# Patient Record
Sex: Female | Born: 1935 | Race: White | Hispanic: No | Marital: Single | Smoking: Never smoker
Health system: Southern US, Community
[De-identification: ages and names within clinical notes are randomized; demographics above are authoritative.]

## PROBLEM LIST (undated history)

## (undated) DIAGNOSIS — I639 Cerebral infarction, unspecified: Secondary | ICD-10-CM

## (undated) DIAGNOSIS — I2510 Atherosclerotic heart disease of native coronary artery without angina pectoris: Secondary | ICD-10-CM

## (undated) DIAGNOSIS — F0390 Unspecified dementia without behavioral disturbance: Secondary | ICD-10-CM

## (undated) DIAGNOSIS — G9340 Encephalopathy, unspecified: Secondary | ICD-10-CM

## (undated) DIAGNOSIS — M1990 Unspecified osteoarthritis, unspecified site: Secondary | ICD-10-CM

## (undated) DIAGNOSIS — H409 Unspecified glaucoma: Secondary | ICD-10-CM

## (undated) DIAGNOSIS — I10 Essential (primary) hypertension: Secondary | ICD-10-CM

## (undated) HISTORY — PX: ABDOMINAL HYSTERECTOMY: SHX81

## (undated) HISTORY — PX: APPENDECTOMY: SHX54

## (undated) HISTORY — PX: CHOLECYSTECTOMY: SHX55

---

## 2012-12-05 ENCOUNTER — Emergency Department (HOSPITAL_BASED_OUTPATIENT_CLINIC_OR_DEPARTMENT_OTHER): Payer: Medicare Other

## 2012-12-05 ENCOUNTER — Emergency Department (HOSPITAL_BASED_OUTPATIENT_CLINIC_OR_DEPARTMENT_OTHER)
Admission: EM | Admit: 2012-12-05 | Discharge: 2012-12-05 | Disposition: A | Discharge status: Skilled Nursing Facility | Payer: Medicare Other | Attending: Emergency Medicine | Admitting: Emergency Medicine

## 2012-12-05 ENCOUNTER — Encounter (HOSPITAL_BASED_OUTPATIENT_CLINIC_OR_DEPARTMENT_OTHER): Payer: Self-pay

## 2012-12-05 DIAGNOSIS — I2510 Atherosclerotic heart disease of native coronary artery without angina pectoris: Secondary | ICD-10-CM | POA: Insufficient documentation

## 2012-12-05 DIAGNOSIS — Y929 Unspecified place or not applicable: Secondary | ICD-10-CM | POA: Insufficient documentation

## 2012-12-05 DIAGNOSIS — Z8669 Personal history of other diseases of the nervous system and sense organs: Secondary | ICD-10-CM | POA: Insufficient documentation

## 2012-12-05 DIAGNOSIS — F0390 Unspecified dementia without behavioral disturbance: Secondary | ICD-10-CM | POA: Insufficient documentation

## 2012-12-05 DIAGNOSIS — Z7982 Long term (current) use of aspirin: Secondary | ICD-10-CM | POA: Insufficient documentation

## 2012-12-05 DIAGNOSIS — Z8673 Personal history of transient ischemic attack (TIA), and cerebral infarction without residual deficits: Secondary | ICD-10-CM | POA: Insufficient documentation

## 2012-12-05 DIAGNOSIS — Z8739 Personal history of other diseases of the musculoskeletal system and connective tissue: Secondary | ICD-10-CM | POA: Insufficient documentation

## 2012-12-05 DIAGNOSIS — Z79899 Other long term (current) drug therapy: Secondary | ICD-10-CM | POA: Insufficient documentation

## 2012-12-05 DIAGNOSIS — I10 Essential (primary) hypertension: Secondary | ICD-10-CM | POA: Insufficient documentation

## 2012-12-05 DIAGNOSIS — S0121XA Laceration without foreign body of nose, initial encounter: Secondary | ICD-10-CM

## 2012-12-05 DIAGNOSIS — S0120XA Unspecified open wound of nose, initial encounter: Secondary | ICD-10-CM | POA: Insufficient documentation

## 2012-12-05 DIAGNOSIS — S022XXB Fracture of nasal bones, initial encounter for open fracture: Secondary | ICD-10-CM | POA: Insufficient documentation

## 2012-12-05 DIAGNOSIS — Z23 Encounter for immunization: Secondary | ICD-10-CM | POA: Insufficient documentation

## 2012-12-05 DIAGNOSIS — Y9389 Activity, other specified: Secondary | ICD-10-CM | POA: Insufficient documentation

## 2012-12-05 DIAGNOSIS — W1809XA Striking against other object with subsequent fall, initial encounter: Secondary | ICD-10-CM | POA: Insufficient documentation

## 2012-12-05 HISTORY — DX: Encephalopathy, unspecified: G93.40

## 2012-12-05 HISTORY — DX: Unspecified glaucoma: H40.9

## 2012-12-05 HISTORY — DX: Atherosclerotic heart disease of native coronary artery without angina pectoris: I25.10

## 2012-12-05 HISTORY — DX: Cerebral infarction, unspecified: I63.9

## 2012-12-05 HISTORY — DX: Unspecified dementia, unspecified severity, without behavioral disturbance, psychotic disturbance, mood disturbance, and anxiety: F03.90

## 2012-12-05 HISTORY — DX: Essential (primary) hypertension: I10

## 2012-12-05 HISTORY — DX: Unspecified osteoarthritis, unspecified site: M19.90

## 2012-12-05 MED ORDER — LIDOCAINE HCL (PF) 1 % IJ SOLN
INTRAMUSCULAR | Status: AC
  Filled 2012-12-05: qty 5

## 2012-12-05 MED ORDER — TETANUS-DIPHTH-ACELL PERTUSSIS 5-2.5-18.5 LF-MCG/0.5 IM SUSP
0.5000 mL | Freq: Once | INTRAMUSCULAR | Status: AC
  Administered 2012-12-05: 0.5 mL via INTRAMUSCULAR
  Filled 2012-12-05: qty 0.5

## 2012-12-05 NOTE — ED Provider Notes (Signed)
History    CSN: [id number] Arrival date & time 12/05/12  0740  First MD Initiated Contact with Patient 12/05/12 [phone]     Chief Complaint  Patient presents with  . Fall  . Facial Injury  . Facial Laceration   (Consider location/radiation/quality/duration/timing/severity/associated sxs/prior Treatment) Patient is a 79 y.o. female presenting with fall and facial injury.  Fall  Facial Injury  Pt brought to the ED via EMS from local [facility] after a mechanical fall this morning while putting on her shoes. She hit her nose on the ground, denies LOC, complaining of moderate aching pain in nose, associated with epistaxis which has since resolved. No neck or back pain. No other injuries.   Past Medical History  Diagnosis Date  . Encephalopathy   . CAD (coronary artery disease)   . Hypertension   . Arthritis   . Dementia   . CVA (cerebral infarction)   . Glaucoma   . Osteoarthritis    Past Surgical History  Procedure Laterality Date  . Abdominal hysterectomy    . Appendectomy    . Cholecystectomy     No family history on file. History  Substance Use Topics  . Smoking status: Not on file  . Smokeless tobacco: Not on file  . Alcohol Use: Not on file   OB History   No data available     Review of Systems All other systems reviewed and are negative except as noted in HPI.   Allergies  Review of patient's allergies indicates not on file.  Home Medications   Current Outpatient Rx  Name  Route  Sig  Dispense  Refill  . amantadine (SYMMETREL) 100 MG capsule   Oral   Take 100 mg by mouth 2 (two) times daily.         Marland Kitchen aspirin 325 MG EC tablet   Oral   Take 325 mg by mouth daily.         Marland Kitchen atorvastatin (LIPITOR) 10 MG tablet   Oral   Take 10 mg by mouth daily.         Marland Kitchen docusate sodium (COLACE) 100 MG capsule   Oral   Take 100 mg by mouth 2 (two) times daily.         . folic acid (FOLVITE) 1 MG tablet   Oral   Take 1 mg by mouth daily.         .  hydrALAZINE (APRESOLINE) 10 MG tablet   Oral   Take 10 mg by mouth 3 (three) times daily.         Marland Kitchen latanoprost (XALATAN) 0.005 % ophthalmic solution      1 drop at bedtime.         . memantine (NAMENDA) 10 MG tablet   Oral   Take 10 mg by mouth 2 (two) times daily.         . methotrexate 1 G injection   Intravenous   Inject into the vein once.         . methylphenidate (RITALIN) 5 MG tablet   Oral   Take 5 mg by mouth 2 (two) times daily.         . metoprolol succinate (TOPROL-XL) 25 MG 24 hr tablet   Oral   Take 25 mg by mouth daily.         . prednisoLONE 5 MG TABS   Oral   Take by mouth.         . rivastigmine (EXELON) 4.6  mg/24hr   Transdermal   Place 1 patch onto the skin daily.         Marland Kitchen senna (SENOKOT) 8.6 MG TABS   Oral   Take 1 tablet by mouth.         . timolol (BETIMOL) 0.25 % ophthalmic solution      1-2 drops 2 (two) times daily.         . verapamil (COVERA HS) 240 MG (CO) 24 hr tablet   Oral   Take 240 mg by mouth at bedtime.          BP 161/47  Pulse 90  Temp(Src) 97.8 F (36.6 C) (Oral)  Resp 18  SpO2 100% Physical Exam  Nursing note and vitals reviewed. Constitutional: She is oriented to person, place, and time. She appears well-developed and well-nourished.  HENT:  Head: Normocephalic.  Laceration x 2 to nose, no active epistaxis, ecchymosis and swelling to nose; no septal hematoma  Eyes: EOM are normal. Pupils are equal, round, and reactive to light.  Neck: Normal range of motion. Neck supple.  Cardiovascular: Normal rate, normal heart sounds and intact distal pulses.   Pulmonary/Chest: Effort normal and breath sounds normal.  Abdominal: Bowel sounds are normal. She exhibits no distension. There is no tenderness.  Musculoskeletal: Normal range of motion. She exhibits no edema and no tenderness.  Lymphadenopathy:    She has no cervical adenopathy.  Neurological: She is alert and oriented to person, place, and  time. She has normal strength. No cranial nerve deficit or sensory deficit.  Skin: Skin is warm and dry. No rash noted.  Psychiatric: She has a normal mood and affect.    ED Course  Procedures (including critical care time)  LACERATION REPAIR #1 Performed by: Pollyann Savoy. Consent: Verbal consent obtained. Risks and benefits: risks, benefits and alternatives were discussed Patient identity confirmed: provided demographic data Time out performed prior to procedure Prepped and Draped in normal sterile fashion Wound explored Laceration Location: nose  Laceration Length: 2.5cm linear No Foreign Bodies seen or palpated Anesthesia: local infiltration Local anesthetic: lidocaine 1% no epinephrine Anesthetic total: 2 ml Irrigation method: syringe Amount of cleaning: standard Skin closure: 5-0 Nylon Number of sutures or staples: 5 Technique: simple interrupted Patient tolerance: Patient tolerated the procedure well with no immediate complications.  LACERATION REPAIR #2 Wound explored Laceration Location: nose Laceration Length: 2.5cm curved No Foreign Bodies seen or palpated Anesthesia: local infiltration Local anesthetic: lidocaine 1% no epinephrine Anesthetic total: 1 ml Irrigation method: syringe Amount of cleaning: standard Skin closure: 5-0 Nylon Number of sutures or staples: 5 Technique: simple interrupted Patient tolerance: Patient tolerated the procedure well with no immediate complications.   Labs Reviewed - No data to display Ct Head Wo Contrast  12/05/2012   *RADIOLOGY REPORT*  Clinical Data:  Status post fall  CT HEAD WITHOUT CONTRAST CT MAXILLOFACIAL WITHOUT CONTRAST  Technique:  Multidetector CT imaging of the head and maxillofacial structures were performed using the standard protocol without intravenous contrast. Multiplanar CT image reconstructions of the maxillofacial structures were also generated.  Comparison:   None.  CT HEAD  Findings: There is diffuse  patchy low density throughout the subcortical and periventricular white matter consistent with chronic small vessel ischemic change.  Right occipital encephalomalacia is identified, image 19/series 2.  Chronic right cerebellar hemisphere infarct is noted.  There is prominence of the sulci and ventricles consistent with brain atrophy.  There is no evidence for acute brain infarct, hemorrhage or mass.  There is a depressed and comminuted fracture involving the nasal bone and nasal septum.  The mastoid air cells are clear.  The calvarium appears intact.  IMPRESSION:  1.  No acute intracranial abnormalities. 2.  Advance small vessel ischemic disease and brain atrophy. 3.  Old right occipital and right cerebellar hemisphere infarcts.  CT MAXILLOFACIAL  Findings:   Comminuted and depressed bilateral nasal bone fracture is identified, image 3/series 3.  There is also a comminuted fracture involving the nasal septum with rightward deviation of the fracture fragments.  Postop change from bilateral median antrectomies noted.  There is mild mucosal thickening involving the right maxillary sinus.  Small defect in the left lamina papyracea is identified consistent with age indeterminant fractures.  The mandible appears intact and located.  IMPRESSION:  1.  Comminuted and depressed fracture of the bilateral nasal bone. 2.  Nasal septum fracture. 3.  Age indeterminant fracture defect involves the left lamina papyracea.   Original Report Authenticated By: Signa Kell, M.D.   Ct Maxillofacial Wo Cm  12/05/2012   *RADIOLOGY REPORT*  Clinical Data:  Status post fall  CT HEAD WITHOUT CONTRAST CT MAXILLOFACIAL WITHOUT CONTRAST  Technique:  Multidetector CT imaging of the head and maxillofacial structures were performed using the standard protocol without intravenous contrast. Multiplanar CT image reconstructions of the maxillofacial structures were also generated.  Comparison:   None.  CT HEAD  Findings: There is diffuse patchy low  density throughout the subcortical and periventricular white matter consistent with chronic small vessel ischemic change.  Right occipital encephalomalacia is identified, image 19/series 2.  Chronic right cerebellar hemisphere infarct is noted.  There is prominence of the sulci and ventricles consistent with brain atrophy.  There is no evidence for acute brain infarct, hemorrhage or mass.  There is a depressed and comminuted fracture involving the nasal bone and nasal septum.  The mastoid air cells are clear.  The calvarium appears intact.  IMPRESSION:  1.  No acute intracranial abnormalities. 2.  Advance small vessel ischemic disease and brain atrophy. 3.  Old right occipital and right cerebellar hemisphere infarcts.  CT MAXILLOFACIAL  Findings:   Comminuted and depressed bilateral nasal bone fracture is identified, image 3/series 3.  There is also a comminuted fracture involving the nasal septum with rightward deviation of the fracture fragments.  Postop change from bilateral median antrectomies noted.  There is mild mucosal thickening involving the right maxillary sinus.  Small defect in the left lamina papyracea is identified consistent with age indeterminant fractures.  The mandible appears intact and located.  IMPRESSION:  1.  Comminuted and depressed fracture of the bilateral nasal bone. 2.  Nasal septum fracture. 3.  Age indeterminant fracture defect involves the left lamina papyracea.   Original Report Authenticated By: Signa Kell, M.D.   1. Nasal bone fracture, open, initial encounter   2. Laceration of nose, initial encounter     MDM  Pt with fall and nasal bone/septal fractures with laceration. Abx for open fracture, close ENT follow up for re-eval, suture removal and discussion of surgical repair if indicated at that time.   Charles B. Bernette Mayers, MD 12/05/12 [phone]

## 2012-12-05 NOTE — ED Notes (Signed)
Fell from a standing position.  Injury/Laceraction to nose- Denies LOC

## 2012-12-05 NOTE — ED Notes (Signed)
md at bedside suturing, pt tolerated well.

## 2012-12-05 NOTE — ED Notes (Signed)
[facility] phoned for pt transport back to [facility].

## 2013-02-21 ENCOUNTER — Emergency Department (HOSPITAL_BASED_OUTPATIENT_CLINIC_OR_DEPARTMENT_OTHER): Payer: Medicare Other

## 2013-02-21 ENCOUNTER — Emergency Department (HOSPITAL_BASED_OUTPATIENT_CLINIC_OR_DEPARTMENT_OTHER)
Admission: EM | Admit: 2013-02-21 | Discharge: 2013-02-21 | Disposition: A | Discharge status: Home / Self Care | Payer: Medicare Other | Attending: Emergency Medicine | Admitting: Emergency Medicine

## 2013-02-21 ENCOUNTER — Encounter (HOSPITAL_BASED_OUTPATIENT_CLINIC_OR_DEPARTMENT_OTHER): Payer: Self-pay

## 2013-02-21 DIAGNOSIS — IMO0002 Reserved for concepts with insufficient information to code with codable children: Secondary | ICD-10-CM | POA: Insufficient documentation

## 2013-02-21 DIAGNOSIS — I10 Essential (primary) hypertension: Secondary | ICD-10-CM | POA: Insufficient documentation

## 2013-02-21 DIAGNOSIS — Z8673 Personal history of transient ischemic attack (TIA), and cerebral infarction without residual deficits: Secondary | ICD-10-CM | POA: Insufficient documentation

## 2013-02-21 DIAGNOSIS — F0390 Unspecified dementia without behavioral disturbance: Secondary | ICD-10-CM | POA: Insufficient documentation

## 2013-02-21 DIAGNOSIS — Z79899 Other long term (current) drug therapy: Secondary | ICD-10-CM | POA: Insufficient documentation

## 2013-02-21 DIAGNOSIS — J069 Acute upper respiratory infection, unspecified: Secondary | ICD-10-CM | POA: Insufficient documentation

## 2013-02-21 DIAGNOSIS — I2510 Atherosclerotic heart disease of native coronary artery without angina pectoris: Secondary | ICD-10-CM | POA: Insufficient documentation

## 2013-02-21 DIAGNOSIS — Z7982 Long term (current) use of aspirin: Secondary | ICD-10-CM | POA: Insufficient documentation

## 2013-02-21 DIAGNOSIS — M069 Rheumatoid arthritis, unspecified: Secondary | ICD-10-CM | POA: Insufficient documentation

## 2013-02-21 DIAGNOSIS — R0602 Shortness of breath: Secondary | ICD-10-CM | POA: Insufficient documentation

## 2013-02-21 DIAGNOSIS — Z8669 Personal history of other diseases of the nervous system and sense organs: Secondary | ICD-10-CM | POA: Insufficient documentation

## 2013-02-21 LAB — CBC WITH DIFFERENTIAL/PLATELET
Basophils Relative: 0 % (ref 0–1)
Eosinophils Absolute: 0.2 10*3/uL (ref 0.0–0.7)
HCT: 29.2 % — ABNORMAL LOW (ref 36.0–46.0)
Hemoglobin: 9 g/dL — ABNORMAL LOW (ref 12.0–15.0)
Lymphs Abs: 0.6 10*3/uL — ABNORMAL LOW (ref 0.7–4.0)
MCH: 28.8 pg (ref 26.0–34.0)
MCHC: 30.8 g/dL (ref 30.0–36.0)
MCV: 93.6 fL (ref 78.0–100.0)
Monocytes Absolute: 1 10*3/uL (ref 0.1–1.0)
Monocytes Relative: 8 % (ref 3–12)
Neutrophils Relative %: 85 % — ABNORMAL HIGH (ref 43–77)
RBC: 3.12 MIL/uL — ABNORMAL LOW (ref 3.87–5.11)

## 2013-02-21 LAB — BASIC METABOLIC PANEL
BUN: 13 mg/dL (ref 6–23)
Creatinine, Ser: 0.8 mg/dL (ref 0.50–1.10)
GFR calc Af Amer: 80 mL/min — ABNORMAL LOW (ref >90)
GFR calc non Af Amer: 69 mL/min — ABNORMAL LOW (ref >90)
Glucose, Bld: 133 mg/dL — ABNORMAL HIGH (ref 70–99)
Potassium: 3.7 mEq/L (ref 3.5–5.1)

## 2013-02-21 NOTE — ED Notes (Signed)
Nursing home staff informed of plan of care.  [facility] called for transport.

## 2013-02-21 NOTE — ED Notes (Signed)
Patient transported to X-ray 

## 2013-02-21 NOTE — ED Notes (Signed)
Pt sent to ED by nursing staff for evaluation of "respiratory distress".

## 2013-02-21 NOTE — ED Provider Notes (Addendum)
CSN: [id number]     Arrival date & time 02/21/13  1103 History   First MD Initiated Contact with Patient 02/21/13 1107     Chief Complaint  Patient presents with  . Hand Problem  . URI   (Consider location/radiation/quality/duration/timing/severity/associated sxs/prior Treatment) HPI Comments: Patient brought in by EMS due to respiratory distress noted by the nursing staff at the nursing facility.  However when EMS arrived patient was breathing normally and denied being short of breath. They questioned the nursing staff and they stated that she was having shortness of breath however their story he continued to change it is difficult to know exactly what was happening. Also EMS noted redness and swelling of her right hand and they stated that was due to the patient's arthritis.  Here patient has no complaints. She denies having any trouble breathing and denies pain of her right hand but has noticed it's been swelling but does not know for how long. She does have a history of rheumatoid arthritis and is on methotrexate injections and prednisone daily.  The history is provided by the patient, the EMS personnel and the nursing home. The history is limited by the absence of a caregiver (pt has dementia).    Past Medical History  Diagnosis Date  . Encephalopathy   . CAD (coronary artery disease)   . Hypertension   . Arthritis   . Dementia   . CVA (cerebral infarction)   . Glaucoma   . Osteoarthritis    Past Surgical History  Procedure Laterality Date  . Abdominal hysterectomy    . Appendectomy    . Cholecystectomy     No family history on file. History  Substance Use Topics  . Smoking status: Never Smoker   . Smokeless tobacco: Not on file  . Alcohol Use: No   OB History   Grav Para Term Preterm Abortions TAB SAB Ect Mult Living                 Review of Systems  Unable to perform ROS   Allergies  Nsaids and Septra  Home Medications   Current Outpatient Rx  Name   Route  Sig  Dispense  Refill  . amantadine (SYMMETREL) 100 MG capsule   Oral   Take 100 mg by mouth 2 (two) times daily.         Marland Kitchen aspirin 325 MG EC tablet   Oral   Take 325 mg by mouth daily.         Marland Kitchen atorvastatin (LIPITOR) 10 MG tablet   Oral   Take 10 mg by mouth daily.         Marland Kitchen docusate sodium (COLACE) 100 MG capsule   Oral   Take 100 mg by mouth 2 (two) times daily.         . folic acid (FOLVITE) 1 MG tablet   Oral   Take 1 mg by mouth daily.         . hydrALAZINE (APRESOLINE) 10 MG tablet   Oral   Take 10 mg by mouth 3 (three) times daily.         Marland Kitchen latanoprost (XALATAN) 0.005 % ophthalmic solution      1 drop at bedtime.         . memantine (NAMENDA) 10 MG tablet   Oral   Take 10 mg by mouth 2 (two) times daily.         . methotrexate 1 G injection   Intravenous  Inject into the vein once.         . methylphenidate (RITALIN) 5 MG tablet   Oral   Take 5 mg by mouth 2 (two) times daily.         . metoprolol succinate (TOPROL-XL) 25 MG 24 hr tablet   Oral   Take 25 mg by mouth daily.         . prednisoLONE 5 MG TABS   Oral   Take by mouth.         . rivastigmine (EXELON) 4.6 mg/24hr   Transdermal   Place 1 patch onto the skin daily.         Marland Kitchen senna (SENOKOT) 8.6 MG TABS   Oral   Take 1 tablet by mouth.         . timolol (BETIMOL) 0.25 % ophthalmic solution      1-2 drops 2 (two) times daily.         . verapamil (COVERA HS) 240 MG (CO) 24 hr tablet   Oral   Take 240 mg by mouth at bedtime.          BP 141/63  Pulse 88  Temp(Src) 99 F (37.2 C) (Oral)  Resp 20  SpO2 96% Physical Exam  Nursing note and vitals reviewed. Constitutional: She is oriented to person, place, and time. She appears well-developed and well-nourished. No distress.  Elderly female in no acute distress  HENT:  Head: Normocephalic and atraumatic.  Mouth/Throat: Oropharynx is clear and moist.  Eyes: Conjunctivae and EOM are normal.  Pupils are equal, round, and reactive to light.  Neck: Normal range of motion. Neck supple.  Cardiovascular: Normal rate, regular rhythm and intact distal pulses.   No murmur heard. Pulmonary/Chest: Effort normal and breath sounds normal. No respiratory distress. She has no wheezes. She has no rales.  Abdominal: Soft. She exhibits no distension. There is no tenderness. There is no rebound and no guarding.  Musculoskeletal: Normal range of motion. She exhibits tenderness. She exhibits no edema.       Right hand: She exhibits swelling. She exhibits normal range of motion. Normal sensation noted. Normal strength noted.       Hands: Prominent kyphosis  Neurological: She is alert and oriented to person, place, and time.  Skin: Skin is warm and dry. No rash noted. No erythema.  Psychiatric: She has a normal mood and affect. Her behavior is normal.    ED Course  Procedures (including critical care time) Labs Review Labs Reviewed  CBC WITH DIFFERENTIAL - Abnormal; Notable for the following:    WBC 11.8 (*)    RBC 3.12 (*)    Hemoglobin 9.0 (*)    HCT 29.2 (*)    RDW 19.0 (*)    Platelets 429 (*)    Neutrophils Relative % 85 (*)    Neutro Abs 10.0 (*)    Lymphocytes Relative 5 (*)    Lymphs Abs 0.6 (*)    All other components within normal limits  BASIC METABOLIC PANEL - Abnormal; Notable for the following:    Glucose, Bld 133 (*)    GFR calc non Af Amer 69 (*)    GFR calc Af Amer 80 (*)    All other components within normal limits   Imaging Review Dg Chest 2 View  02/21/2013   CLINICAL DATA:  Respiratory distress.  EXAM: CHEST  2 VIEW  COMPARISON:  None.  FINDINGS: Lung volumes are low with crowding of the bronchovascular structures. Heart  size is upper normal. No pulmonary edema, consolidative process or pneumothorax. No pleural fluid is identified.  IMPRESSION: No acute finding in a low volume chest.   Electronically Signed   By: Drusilla Kanner M.D.   On: 02/21/2013 12:17   Dg  Hand Complete Right  02/21/2013   CLINICAL DATA:  Hand problem. Redness, swelling.  EXAM: RIGHT HAND - COMPLETE 3+ VIEW  COMPARISON:  None.  FINDINGS: Degenerative changes in the DIP and PIP joints diffusely. Advanced degenerative changes at the 1st carpometacarpal joint. MCP joint spaces are maintained. No bony erosions. No acute bony abnormality. Specifically, no fracture, subluxation, or dislocation. Soft tissues are intact.  IMPRESSION: Advanced degenerative joint disease. No acute findings.   Electronically Signed   By: Charlett Nose M.D.   On: 02/21/2013 12:30    MDM   1. Rheumatoid arthritis flare     Patient sent here from a nursing facility for unknown reason. They initially told EMS the patient was in respiratory distress however when EMS arrived patient was breathing comfortably and stated she had no complaints. Here also patient denies any shortness of breath. Her lungs are clear and she is not tachypneic. However on evaluation she does have a warm swollen and right hands. Per EMS nursing facility states that that is from her arthritis and may deny any recent trauma. Patient also denies trauma or any recent blood sticks from that hand. Patient has no complaints. Otherwise exam is benign.  No history of CHF  X-ray of the hand pending to ensure no acute fracture. Chest x-ray, CBC, BMP pending  12:58 PM Most likely stable anemia of 9.0 but o/w normal labs and films except for hand arthritis.  Will continue to have pt elevate hand most likely RA exacerbation.  Gwyneth Sprout, MD 02/21/13 1259  Gwyneth Sprout, MD 02/21/13 1300

## 2013-02-21 NOTE — ED Notes (Signed)
Pt returned from radiology.

## 2013-08-04 DEATH — deceased

## 2014-12-11 IMAGING — CR DG CHEST 2V
1 series · 1 of 1 positions shown · non-contrast
Comparison: None.

CLINICAL DATA: Respiratory distress.

EXAM:
CHEST  2 VIEW

[view not recorded]
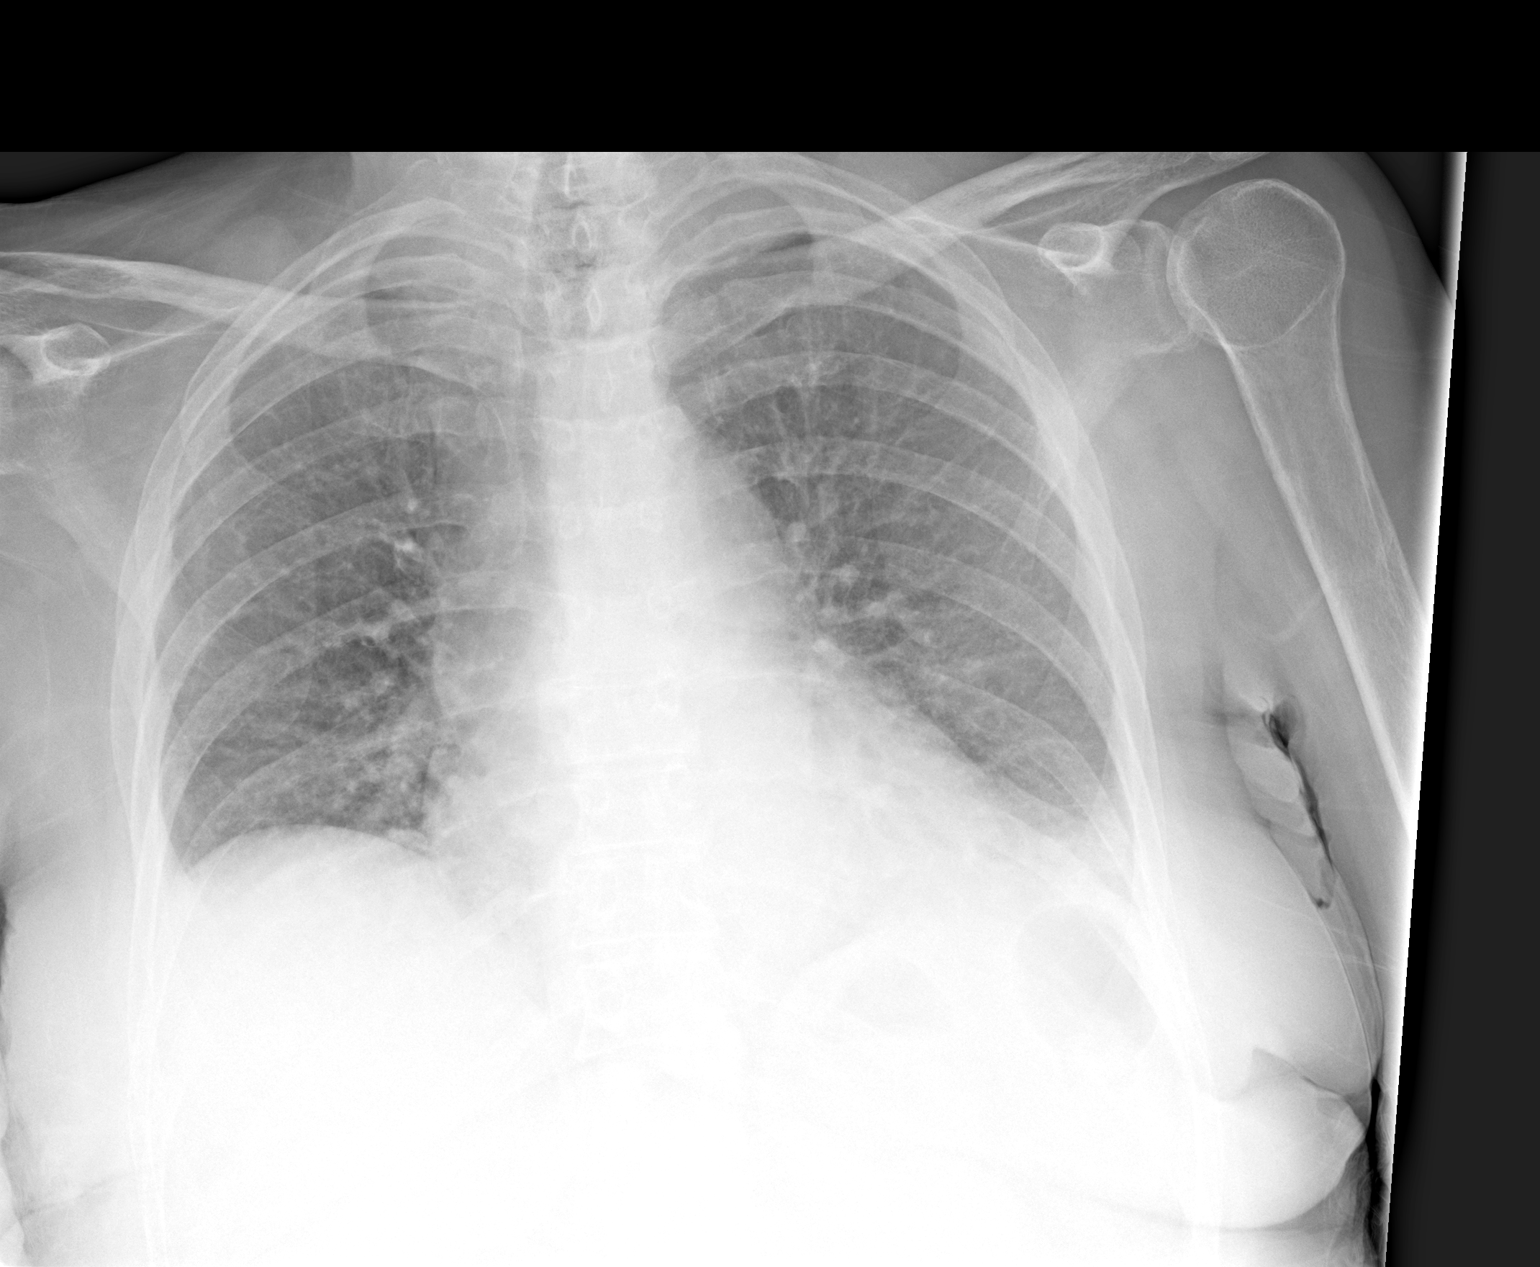

[1 of 1 positions shown; findings below may reference images not displayed]

FINDINGS: Lung volumes are low with crowding of the bronchovascular
structures. Heart size is upper normal. No pulmonary edema,
consolidative process or pneumothorax. No pleural fluid is
identified.
IMPRESSION: No acute finding in a low volume chest.

## 2022-03-20 NOTE — Progress Notes (Signed)
[facility] PRE[facility] Weekly Session  Patient Details  Name: Claudia Nguyen MRN: [id number] Date of Birth: [birth year] Age: 88 y.o. PCP: No primary care provider on file.  Vitals:   03/14/22 1030  Weight: 219 lb (99.3 kg)     [facility] Weekly seesion - 03/20/22 1800       [facility]PRE[facility]" Location   [facility]      Weekly Session   Topic Discussed Healthy eating tips    Minutes exercised this week 75 minutes    Classes attended to date [place] 03/20/2022, 6:30 PM
